# Patient Record
Sex: Male | Born: 1990 | Race: Black or African American | Hispanic: No | Marital: Single | Smoking: Never smoker
Health system: Southern US, Community
[De-identification: ages and names within clinical notes are randomized; demographics above are authoritative.]

---
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
## 2011-03-10 ENCOUNTER — Emergency Department (HOSPITAL_COMMUNITY)
Admission: EM | Admit: 2011-03-10 | Discharge: 2011-03-10 | Disposition: A | Discharge status: Home / Self Care | Payer: Medicaid Other | Attending: Emergency Medicine | Admitting: Emergency Medicine

## 2011-03-10 ENCOUNTER — Emergency Department (HOSPITAL_COMMUNITY): Payer: Medicaid Other

## 2011-03-10 DIAGNOSIS — M25539 Pain in unspecified wrist: Secondary | ICD-10-CM | POA: Insufficient documentation

## 2011-03-10 DIAGNOSIS — Y9355 Activity, bike riding: Secondary | ICD-10-CM | POA: Insufficient documentation

## 2012-01-03 ENCOUNTER — Emergency Department (HOSPITAL_COMMUNITY): Payer: Medicaid Other

## 2012-01-03 ENCOUNTER — Emergency Department (HOSPITAL_COMMUNITY)
Admission: EM | Admit: 2012-01-03 | Discharge: 2012-01-03 | Disposition: A | Discharge status: Home / Self Care | Payer: Medicaid Other | Attending: Emergency Medicine | Admitting: Emergency Medicine

## 2012-01-03 ENCOUNTER — Encounter (HOSPITAL_COMMUNITY): Payer: Self-pay | Admitting: Emergency Medicine

## 2012-01-03 DIAGNOSIS — R55 Syncope and collapse: Secondary | ICD-10-CM | POA: Insufficient documentation

## 2012-01-03 DIAGNOSIS — R002 Palpitations: Secondary | ICD-10-CM | POA: Insufficient documentation

## 2012-01-03 DIAGNOSIS — R079 Chest pain, unspecified: Secondary | ICD-10-CM | POA: Insufficient documentation

## 2012-01-03 LAB — DIFFERENTIAL
Basophils Absolute: 0 10*3/uL (ref 0.0–0.1)
Eosinophils Absolute: 0.4 10*3/uL (ref 0.0–0.7)
Eosinophils Relative: 4 % (ref 0–5)
Neutrophils Relative %: 64 % (ref 43–77)

## 2012-01-03 LAB — BASIC METABOLIC PANEL
Calcium: 9.6 mg/dL (ref 8.4–10.5)
GFR calc Af Amer: >90 mL/min (ref >90)
GFR calc non Af Amer: >90 mL/min (ref >90)
Potassium: 3.8 mEq/L (ref 3.5–5.1)
Sodium: 139 mEq/L (ref 135–145)

## 2012-01-03 LAB — CBC
MCH: 26.9 pg (ref 26.0–34.0)
MCV: 82.5 fL (ref 78.0–100.0)
Platelets: 177 10*3/uL (ref 150–400)
RBC: 5.09 MIL/uL (ref 4.22–5.81)
RDW: 14.5 % (ref 11.5–15.5)
WBC: 10.5 10*3/uL (ref 4.0–10.5)

## 2012-01-03 MED ORDER — IBUPROFEN 800 MG PO TABS: 800.0000 mg | ORAL_TABLET | Freq: Three times a day (TID) | ORAL | Status: AC | PRN

## 2012-01-03 NOTE — ED Notes (Signed)
Pt taken to xray 

## 2012-01-03 NOTE — ED Notes (Signed)
Dr Fredderick Phenix at bedside, and pt given crackers and coke.

## 2012-01-03 NOTE — Discharge Instructions (Signed)
Chest Pain (Nonspecific) Chest pain has many causes. Your pain could be caused by something serious, such as a heart attack or a blood clot in the lungs. It could also be caused by something less serious, such as a chest bruise or a virus. Follow up with your doctor. More lab tests or other studies may be needed to find the cause of your pain. Most of the time, nonspecific chest pain will improve within 2 to 3 days of rest and mild pain medicine. HOME CARE  For chest bruises, you may put ice on the sore area for 15 to 20 minutes, 3 to 4 times a day. Do this only if it makes you or your child feel better.   Put ice in a plastic bag.   Place a towel between the skin and the bag.   Rest for the next 2 to 3 days.   Go back to work if the pain improves.   See your doctor if the pain lasts longer than 1 to 2 weeks.   Only take medicine as told by your doctor.   Quit smoking if you smoke.  GET HELP RIGHT AWAY IF:   There is more pain or pain that spreads to the arm, neck, jaw, back, or belly (abdomen).   You or your child has shortness of breath.   You or your child coughs more than usual or coughs up blood.   You or your child has very bad back or belly pain, feels sick to his or her stomach (nauseous), or throws up (vomits).   You or your child has very bad weakness.   You or your child passes out (faints).   You or your child has a temperature by mouth above 102 F (38.9 C), not controlled by medicine.  Any of these problems may be serious and may be an emergency. Do not wait to see if the problems will go away. Get medical help right away. Call your local emergency services 911 in [place] Do not drive yourself to the hospital. MAKE SURE YOU:   Understand these instructions.   Will watch this condition.   Will get help right away if you or your child is not doing well or gets worse.  Document Released: 02/17/2008 Document Revised: 08/20/2011 Document Reviewed:  02/17/2008 [facility] Patient Information 2012 [place], [place].Syncope You have had a fainting (syncopal) spell. A fainting episode is a sudden, short-lived loss of consciousness. It results in complete recovery. It occurs because there has been a temporary shortage of oxygen and/or sugar (glucose) to the brain. CAUSES   Blood pressure pills and other medications that may lower blood pressure below normal. Sudden changes in posture (sudden standing).   Over-medication. Take your medications as directed.   Standing too long. This can cause blood to pool in the legs.   Seizure disorders.   Low blood sugar (hypoglycemia) of diabetes. This more commonly causes coma.   Bearing down to go to the bathroom. This can cause your blood pressure to rise suddenly. Your body compensates by making the blood pressure too low when you stop bearing down.   Hardening of the arteries where the brain temporarily does not receive enough blood.   Irregular heart beat and circulatory problems.   Fear, emotional distress, injury, sight of blood, or illness.  Your caregiver will send you home if the syncope was from non-worrisome causes (benign). Depending on your age and health, you may stay to be monitored and observed. If you return  home, have someone stay with you if your caregiver feels that is desirable. It is very important to keep all follow-up referrals and appointments in order to properly manage this condition. This is a serious problem which can lead to serious illness and death if not carefully managed.  WARNING: Do not drive or operate machinery until your caregiver feels that it is safe for you to do so. SEEK IMMEDIATE MEDICAL CARE IF:   You have another fainting episode or faint while lying or sitting down. DO NOT DRIVE YOURSELF. Call 911 if no other help is available.   You have chest pain, are feeling sick to your stomach (nausea), vomiting or abdominal pain.   You have an irregular heartbeat or  one that is very fast (pulse over 120 beats per minute).   You have a loss of feeling in some part of your body or lose movement in your arms or legs.   You have difficulty with speech, confusion, severe weakness, or visual problems.   You become sweaty and/or feel light headed.  Make sure you are rechecked as instructed. Document Released: 08/31/2005 Document Revised: 08/20/2011 Document Reviewed: 04/21/2007 [facility] Patient Information 2012 [place], [place].

## 2012-01-03 NOTE — ED Notes (Signed)
Pt here for near syncopal episode after eating today, sts before episode felt a fluttering in chest, nad noted, abc intact, alert and oriented.

## 2012-01-03 NOTE — ED Notes (Addendum)
Pt reports decreased appetite today with headache and abd pain.  States he ate steak sandwich at friend's house approx 1 hour ago and then passed out for 30-45 minutes.  Denies falling and hitting head.  States EMS came but he did not get transported.

## 2012-01-04 NOTE — ED Provider Notes (Signed)
History     CSN: [id number]  Arrival date & time 01/03/12  1928   First MD Initiated Contact with Patient 01/03/12 1949      Chief Complaint  Patient presents with  . Near Syncope    syncopal episode    Location-substernal/No radiation/quality-dull/duration-~1 hours/timing-intermittent/severity-moderate/associated sxs-syncope/No prior treatment) Patient is a 21 y.o. male presenting with syncope. The history is provided by the patient. No language interpreter was used.  Loss of Consciousness This is a new problem. The current episode started today. The problem occurs rarely. The problem has been resolved. Associated symptoms include chest pain. Pertinent negatives include no abdominal pain, change in bowel habit, chills, congestion, coughing, diaphoresis, fatigue, fever, headaches, nausea, neck pain, numbness, rash, urinary symptoms, vertigo, visual change, vomiting or weakness. The symptoms are aggravated by nothing. He has tried nothing for the symptoms.    History reviewed. No pertinent past medical history.  History reviewed. No pertinent past surgical history.  No family history on file.  History  Substance Use Topics  . Smoking status: Current Everyday Smoker  . Smokeless tobacco: Not on file  . Alcohol Use: Yes      Review of Systems  Constitutional: Negative for fever, chills, diaphoresis and fatigue.  HENT: Negative for congestion, neck pain and neck stiffness.   Eyes: Negative for visual disturbance.  Respiratory: Negative for cough, chest tightness and shortness of breath.   Cardiovascular: Positive for chest pain and syncope. Negative for palpitations and leg swelling.  Gastrointestinal: Negative for nausea, vomiting, abdominal pain, diarrhea, constipation, blood in stool, abdominal distention and change in bowel habit.  Genitourinary: Negative for dysuria, urgency, hematuria and difficulty urinating.  Musculoskeletal: Negative for back pain and gait problem.   Skin: Negative for rash.  Neurological: Positive for syncope. Negative for dizziness, vertigo, tremors, seizures, facial asymmetry, speech difficulty, weakness, light-headedness, numbness and headaches.  Hematological: Negative for adenopathy. Does not bruise/bleed easily.  Psychiatric/Behavioral: Negative for confusion.    Allergies  Review of patient's allergies indicates no known allergies.  Home Medications   Current Outpatient Rx  Name Route Sig Dispense Refill  . IBUPROFEN 800 MG PO TABS Oral Take 1 tablet (800 mg total) by mouth every 8 (eight) hours as needed for pain. 30 tablet 0    BP 95/42  Pulse 76  Temp(Src) 97.8 F (36.6 C) (Oral)  Resp 23  SpO2 96%  Physical Exam  Constitutional: He is oriented to person, place, and time. He appears well-developed and well-nourished. No distress.  HENT:  Head: Normocephalic.  Eyes: Conjunctivae and EOM are normal. Pupils are equal, round, and reactive to light.  Neck: Normal range of motion. Neck supple. No JVD present.  Cardiovascular: Normal rate, regular rhythm, normal heart sounds and intact distal pulses.   No murmur heard. Pulmonary/Chest: Effort normal and breath sounds normal. No stridor. No respiratory distress. He has no wheezes. He has no rales. He exhibits no tenderness.  Abdominal: Soft. Bowel sounds are normal. He exhibits no distension and no mass. There is no tenderness. There is no rebound and no guarding.  Musculoskeletal: Normal range of motion. He exhibits no edema and no tenderness.  Neurological: He is alert and oriented to person, place, and time. He has normal strength. No cranial nerve deficit or sensory deficit. Coordination normal. GCS eye subscore is 4. GCS verbal subscore is 5. GCS motor subscore is 6.  Skin: Skin is warm and dry. He is not diaphoretic.  Psychiatric: He has a normal mood and affect.  ED Course  Procedures (including critical care time)  Labs Reviewed  BASIC METABOLIC PANEL -  Abnormal; Notable for the following:    Glucose, Bld 117 (*)    All other components within normal limits  CBC  DIFFERENTIAL   Dg Chest 2 View  01/03/2012  *RADIOLOGY REPORT*  Clinical Data: Chest pain and syncope.  Smoker.  CHEST - 2 VIEW  Comparison: None.  Findings: Normal heart size and pulmonary vascularity.  No focal airspace consolidation in the lungs.  No blunting of costophrenic angles.  No pneumothorax.  IMPRESSION: No evidence of active pulmonary disease.  Original Report Authenticated By: Marlon Pel, M.D.     1. Chest pain   2. Syncope       Date: 01/04/2012  Rate: 78  Rhythm: normal sinus rhythm  QRS Axis: indeterminate  Intervals: normal  ST/T Wave abnormalities: nonspecific ST changes  Conduction Disutrbances:none  Narrative Interpretation: Slight ST elevation <1 box in V1 and V2.  Old EKG Reviewed: none available   MDM  Pt is a well appearing 20yo AAM who reports hx of murmur per prison physician and chest pain. Pt had an episode of CP, palpitations and syncope today. Witnessed, no seizure like activity. Sx resolved presently. VSS. AF. NAD. EKG as above along with one isolated beat in V2 with type 1 brugada quality. Cardiology consulted who recommends overnight observation but pt states "he has things to do and can't stay". Cardiology stated they would set up f/u for pt. No focal neuro deficits. Syncope possibly vasovagal vs arrhythmia. D/C home in stable and asymptomatic condition.         Consuello Masse, MD 01/04/12 0100

## 2012-01-04 NOTE — Consult Note (Signed)
Tyler Mccarthy, AGENA NO.:  [id number]  MEDICAL RECORD NO.:  [id number]  LOCATION:  [facility]                        FACILITY:  [facility]  PHYSICIAN:  Natasha Bence, MD       DATE OF BIRTH:  [birth year]  DATE OF CONSULTATION:  01/03/2012 DATE OF DISCHARGE:                                CONSULTATION   CONSULTING PHYSICIAN:  Dr. March Rummage in the ER.  REASON FOR CONSULTATION:  The patient with syncope.  HISTORY OF PRESENT ILLNESS:  Tyler Mccarthy is a 21 year old white male with no past medical history, who presented after an episode of syncope earlier this evening.  He reports he smoked marijuana at approximately 7 o'clock.  Approximately1/2 hour after that he went to get something to eat.  While he was eating, he felt relatively fine and then became somewhat diaphoretic.  Upon rising, he became lightheaded with heart racing and fell over.  He reports that he thinks he lost complete consciousness.  His mom and a sister say he was out for approximately 5 minutes.  Witnesses say that he had some convulsing type movements and his eyes rolled back.  He does remember few minutes later hearing voices.  He did not have any postictal state.  He just remembers very short duration palpitations and chest tightness that was substernal and did not radiate.  He has not had any shortness of breath.  He has never had any previous lightheadedness or syncope that he can recall.  He is sure that there was nothing else in the marijuana.  He denies any other stimulant medications, over-the-counter, or illicit drugs.  He has not had any recent fevers, chills, or sweats.  No recent viral illnesses. Otherwise complete, review of systems was performed and was negative.  PAST MEDICAL HISTORY:  None.  PAST SURGICAL HISTORY:  None.  FAMILY HISTORY:  No history of sudden cardiac death or early death.  SOCIAL HISTORY:  Does use marijuana.  No other illicit drug use.  Uses occasional alcohol.  HOME  MEDICATIONS:  None.  ALLERGIES:  No known drug allergies.  PHYSICAL EXAMINATION:  VITAL SIGNS:  Temperature is 97.8, pulse 76 and regular, respiratory rate 23, blood pressure 95/42, O2 sats 96% on room air. GENERAL:  He is a well-developed, well-nourished white male; in no apparent distress. EYES:  He has anicteric sclerae.  Pupils equal, round, and reactive. NECK:  Normal jugular venous pressure.  No carotid bruits. LUNGS:  Clear to auscultation bilaterally. CARDIOVASCULAR:  Regular rate and rhythm.  No murmurs, rubs, or gallops. Nondisplaced PMI. ABDOMEN:  Soft, nontender, and nondistended.  No masses. EXTREMITIES:  Warm with no edema.  He has symmetrical pulses throughout. NEUROLOGIC:  Grossly afocal. SKIN:  No rashes or ulcers.  LABORATORY DATA:  Sodium 139, potassium 3.8, chloride 103, bicarb 25, BUN 13, creatinine 1.07, calcium 9.6, glucose 117.  White count 10.5 with normal differential, hemoglobin 13.7, and platelet count 177. Chest x-ray shows no acute infiltrate or effusion.  EKG shows sinus mechanism, rate of 78 beats per minute.  No acute ischemic changes.  He does have one isolated QRS complex, which is a sinus beat in leads V1 through V3, which does  have unusual sloping of his ST segments and a right bundle-branch pattern.  I performed a very limited bedside echo, as the ER physician thought he had a thick septum on their examination. His LV function is normal.  He has no obvious segmental wall motion abnormalities.  He does not have any obvious hypertrophy.  No pericardial effusion.  IMPRESSION AND PLAN:  This is a 21 year old white male with known past medical history, who presents after an episode of syncope now.  It does sound like he did lose consciousness briefly concerning for vasovagal versus arrhythmic syncope given his history.  This was in the setting of intoxication with marijuana.  No other obvious illicit drug use. Recommended checking a urine drug  screen and admission for further evaluation with formal echocardiogram and repeat electrocardiogram.  The patient is adamant.  He does not want to be admitted tonight because he has something to do.  Discussed the risks of potentially fatal arrhythmic event, and he and his sister understand the repercussions. He does agree to follow up in the clinic.  His electrocardiogram is basically normal; however, interestingly he does have one isolated beat in leads V1, V2, and V3, which have sort of a Brugada-like pattern. However, this is just a one isolated beat and may be artifactual.  He has no obvious sympathomimetic triggers, although his marijuana could have ultimately been laced with some additional agents.  The patient is leaving the hospital against my medical advice and will be followed up in the clinic.  I discussed this plan with the ED.          ______________________________ Natasha Bence, MD     MH/MEDQ  D:  01/03/2012  T:  01/04/2012  Job:  [id number]

## 2012-01-06 NOTE — ED Provider Notes (Signed)
I saw and evaluated the patient, reviewed the resident's note and I agree with the findings and plan. Pt with palpitations, syncope, ?evidence of Brugada on EKG.  Pt refusing to be admitted for obs.  Cards to follow  Rolan Bucco, MD 01/06/12 1456

## 2012-01-11 IMAGING — CR DG WRIST COMPLETE 3+V*R*
4 series · 4 of 4 positions shown · non-contrast
Comparison: None

CLINICAL DATA: Fall, wrist pain.

RIGHT WRIST - COMPLETE 3+ VIEW

[x wrist pa right *]
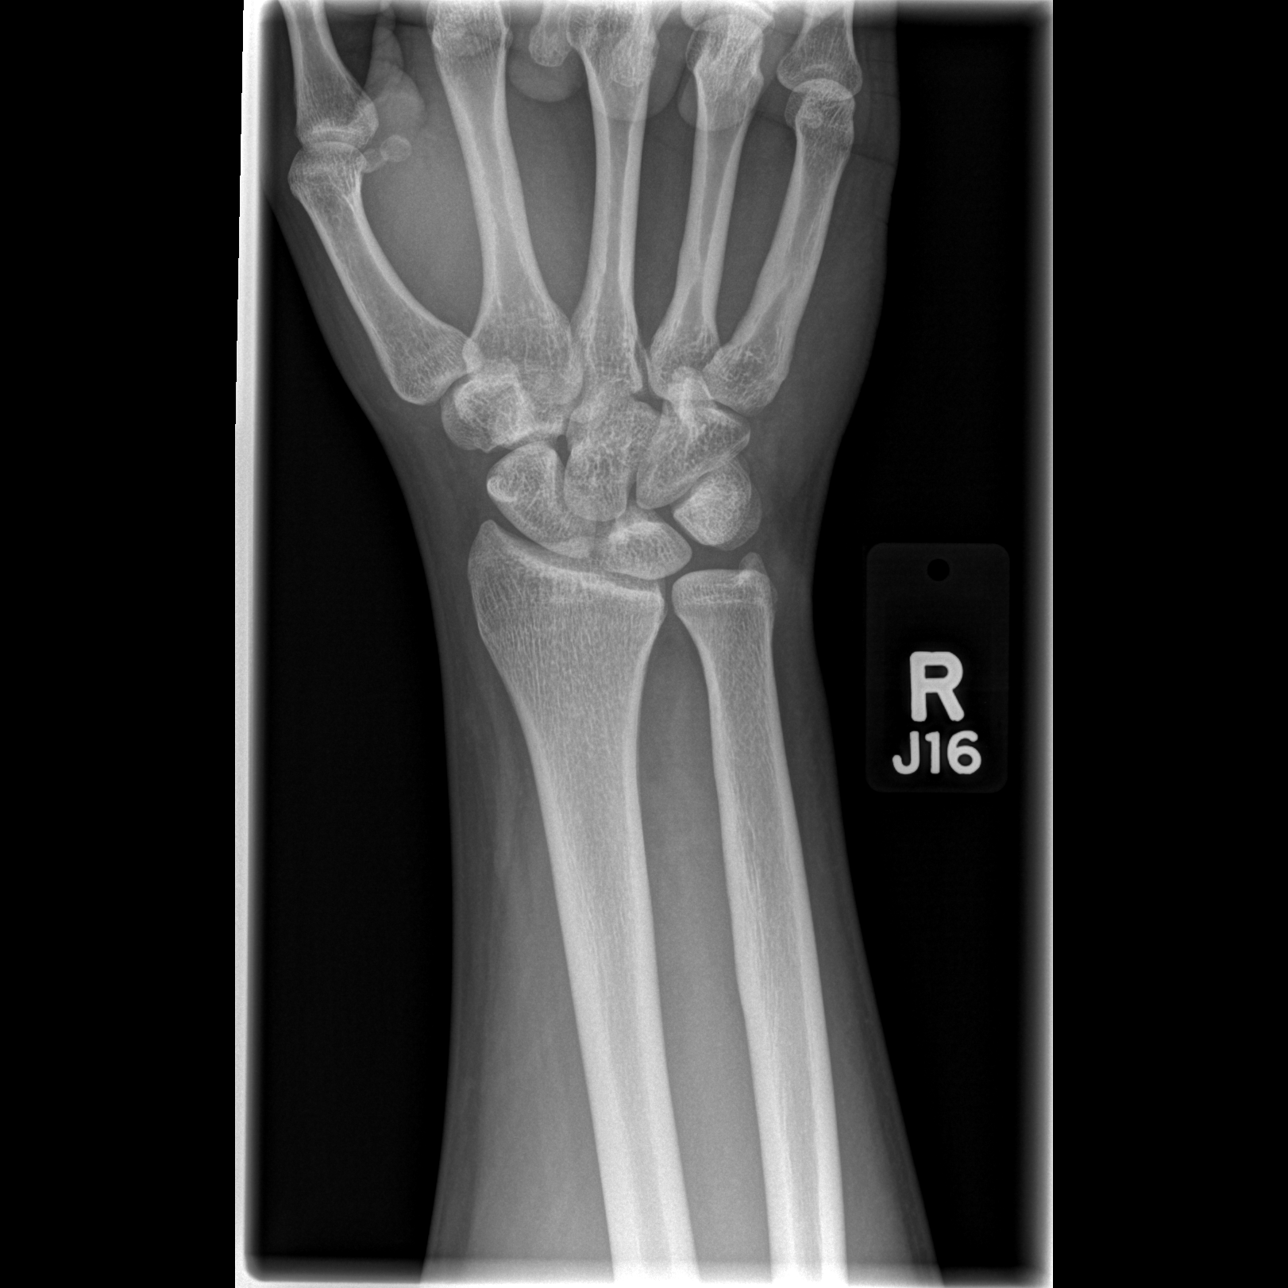

[x wrist obl right]
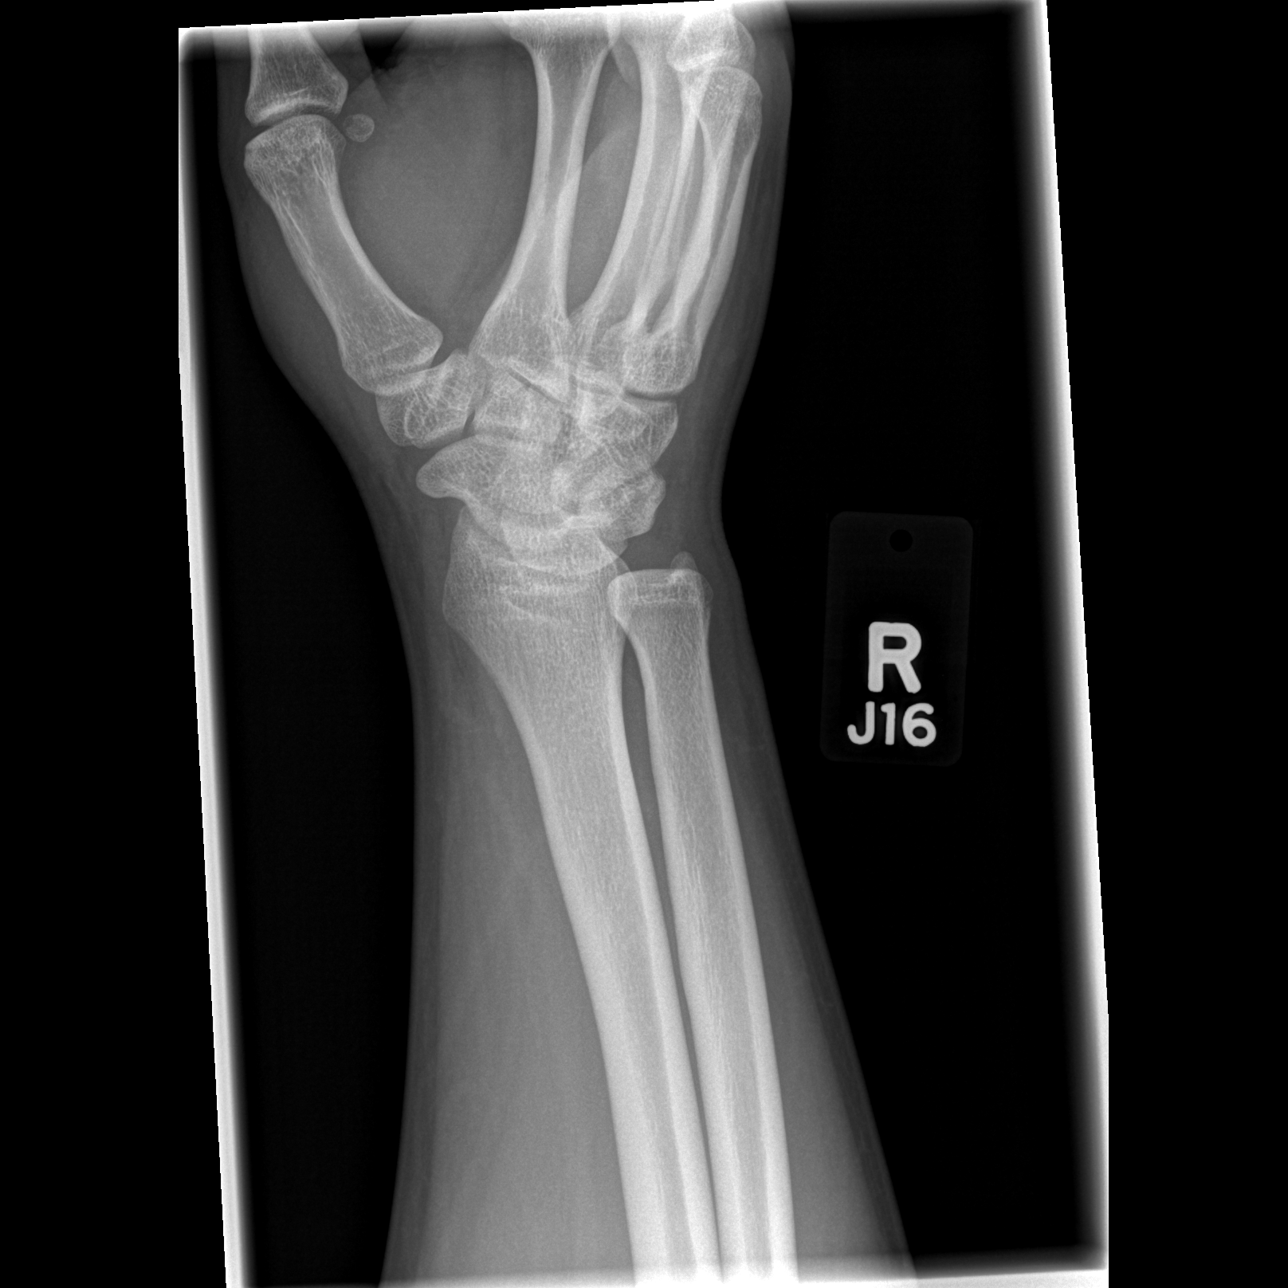

[x wrist lat right]
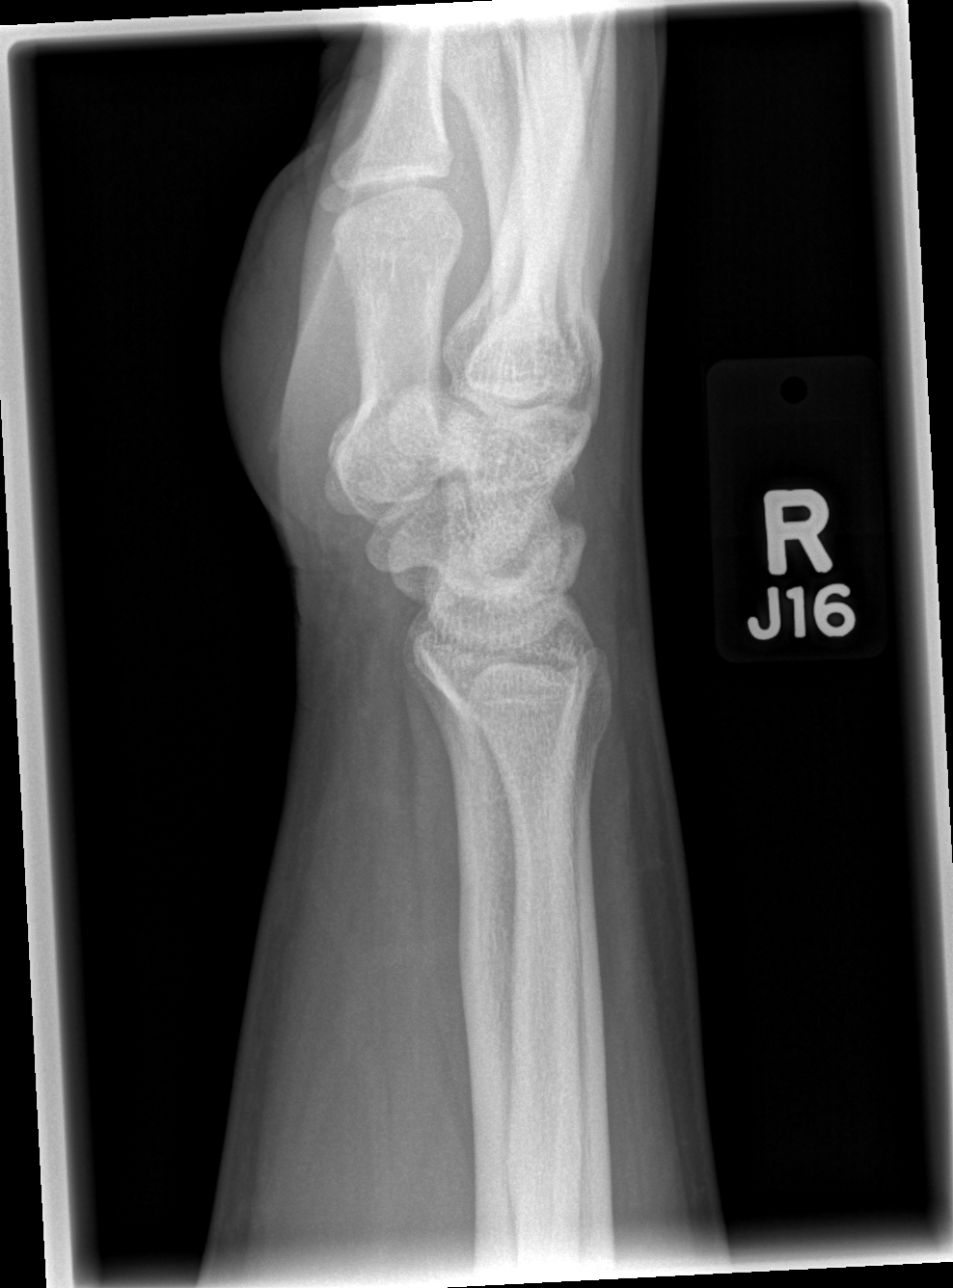

[x navicular *]
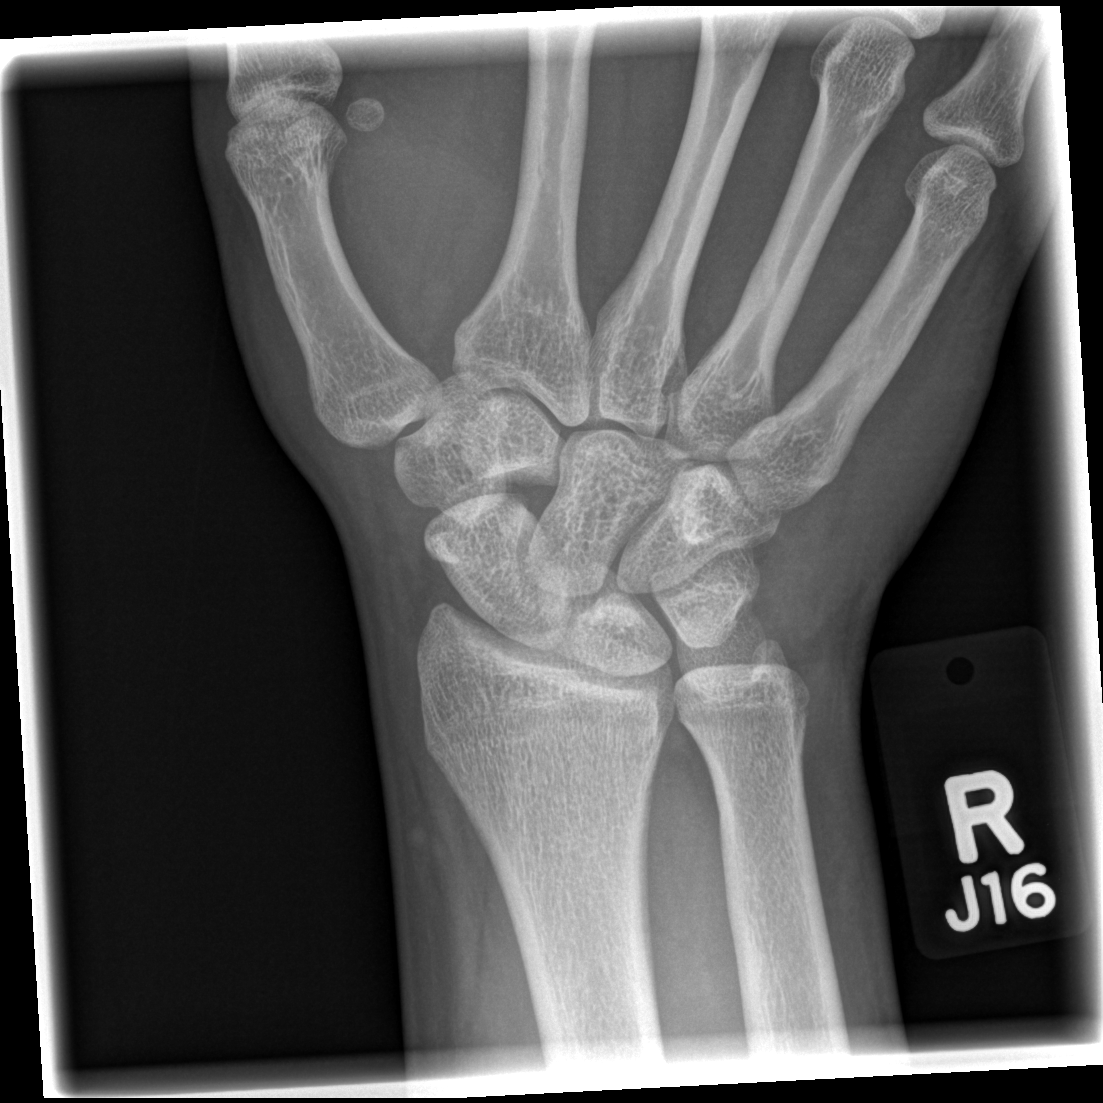

[4 of 4 positions shown; findings below may reference images not displayed]

FINDINGS: No acute bony abnormality.  Specifically, no fracture,
subluxation, or dislocation.  Soft tissues are intact.
IMPRESSION: No acute bony abnormality.

## 2012-11-05 IMAGING — CR DG CHEST 2V
2 series · 2 of 2 positions shown · non-contrast
Comparison: None.

CLINICAL DATA: Chest pain and syncope.  Smoker.

CHEST - 2 VIEW

[w chest pa]
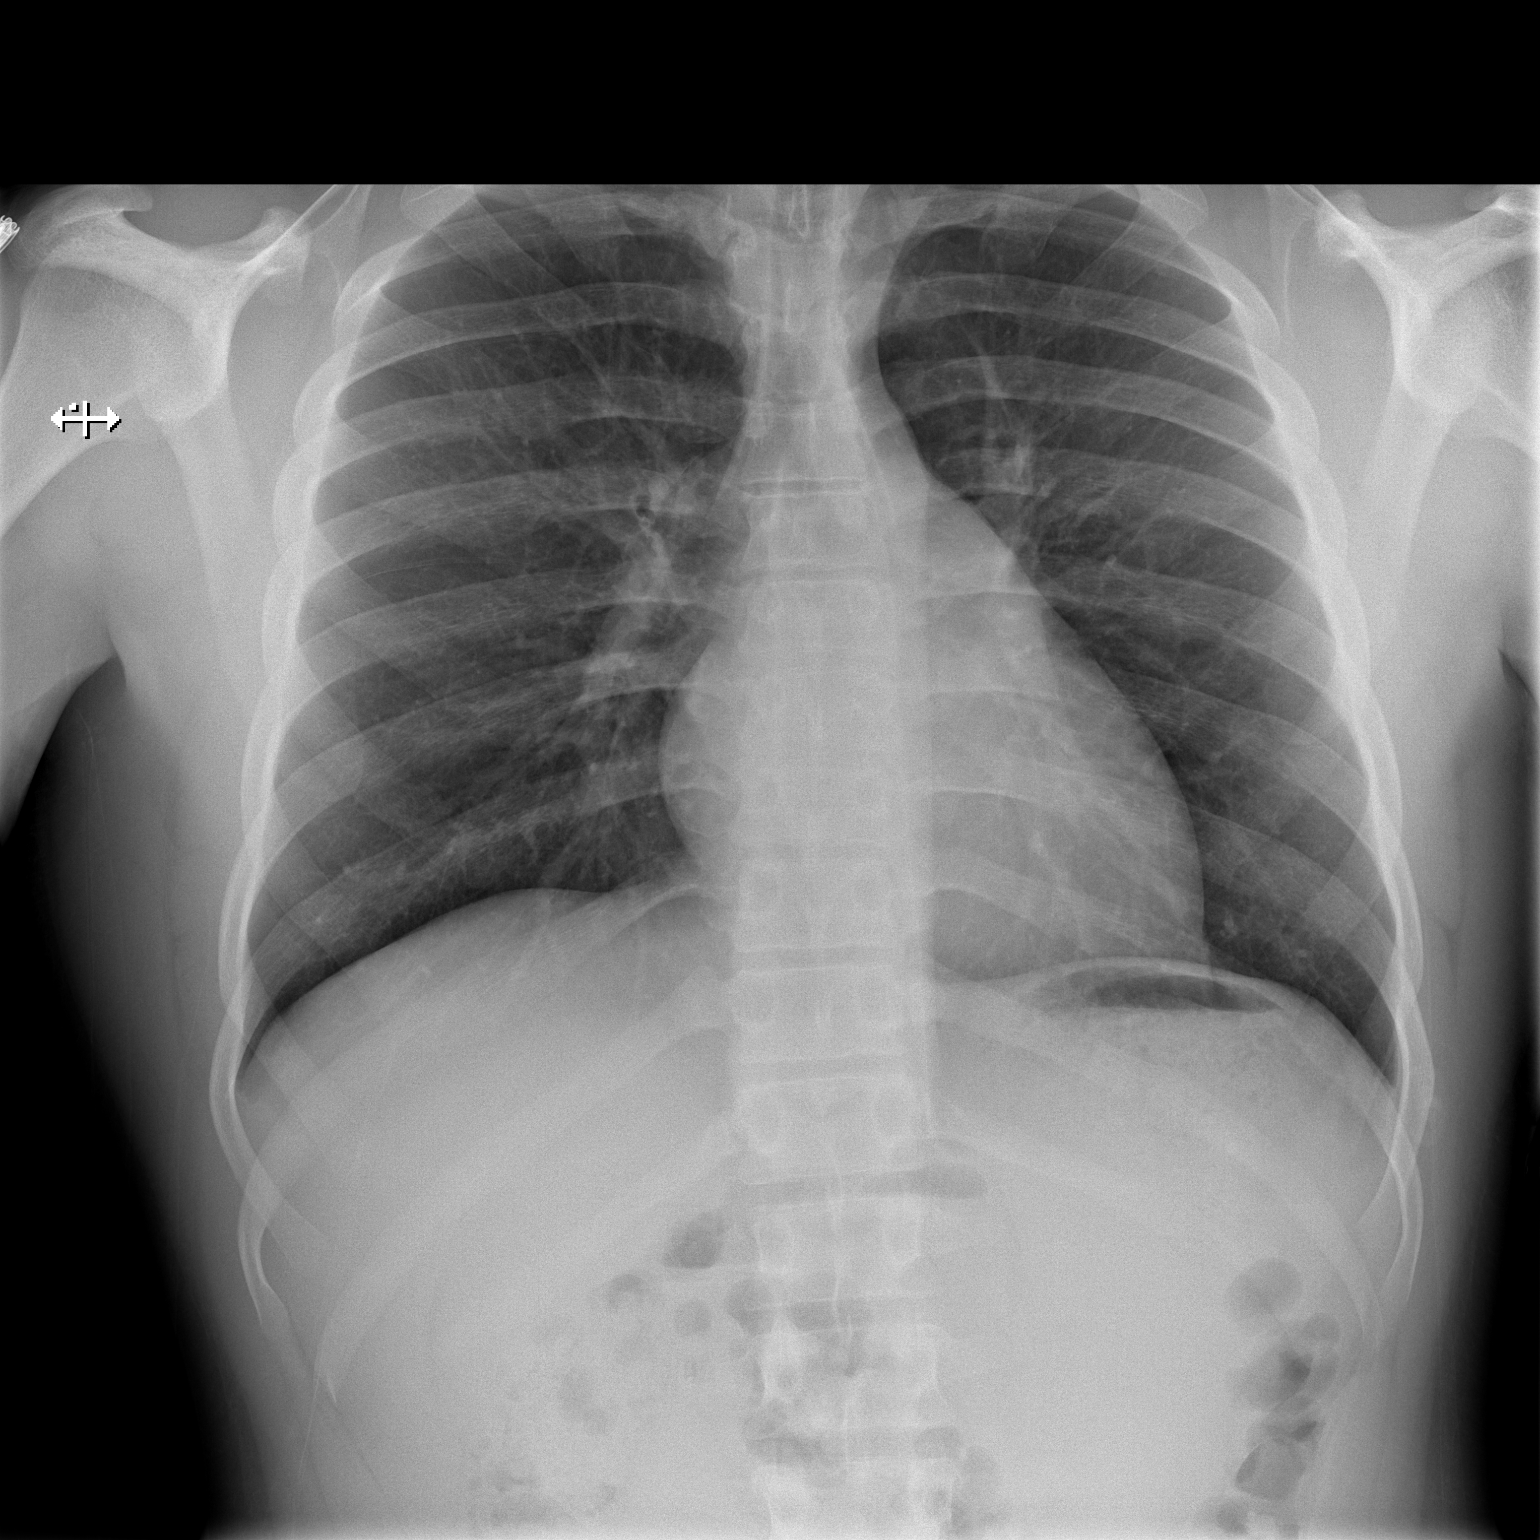

[w chest lat]
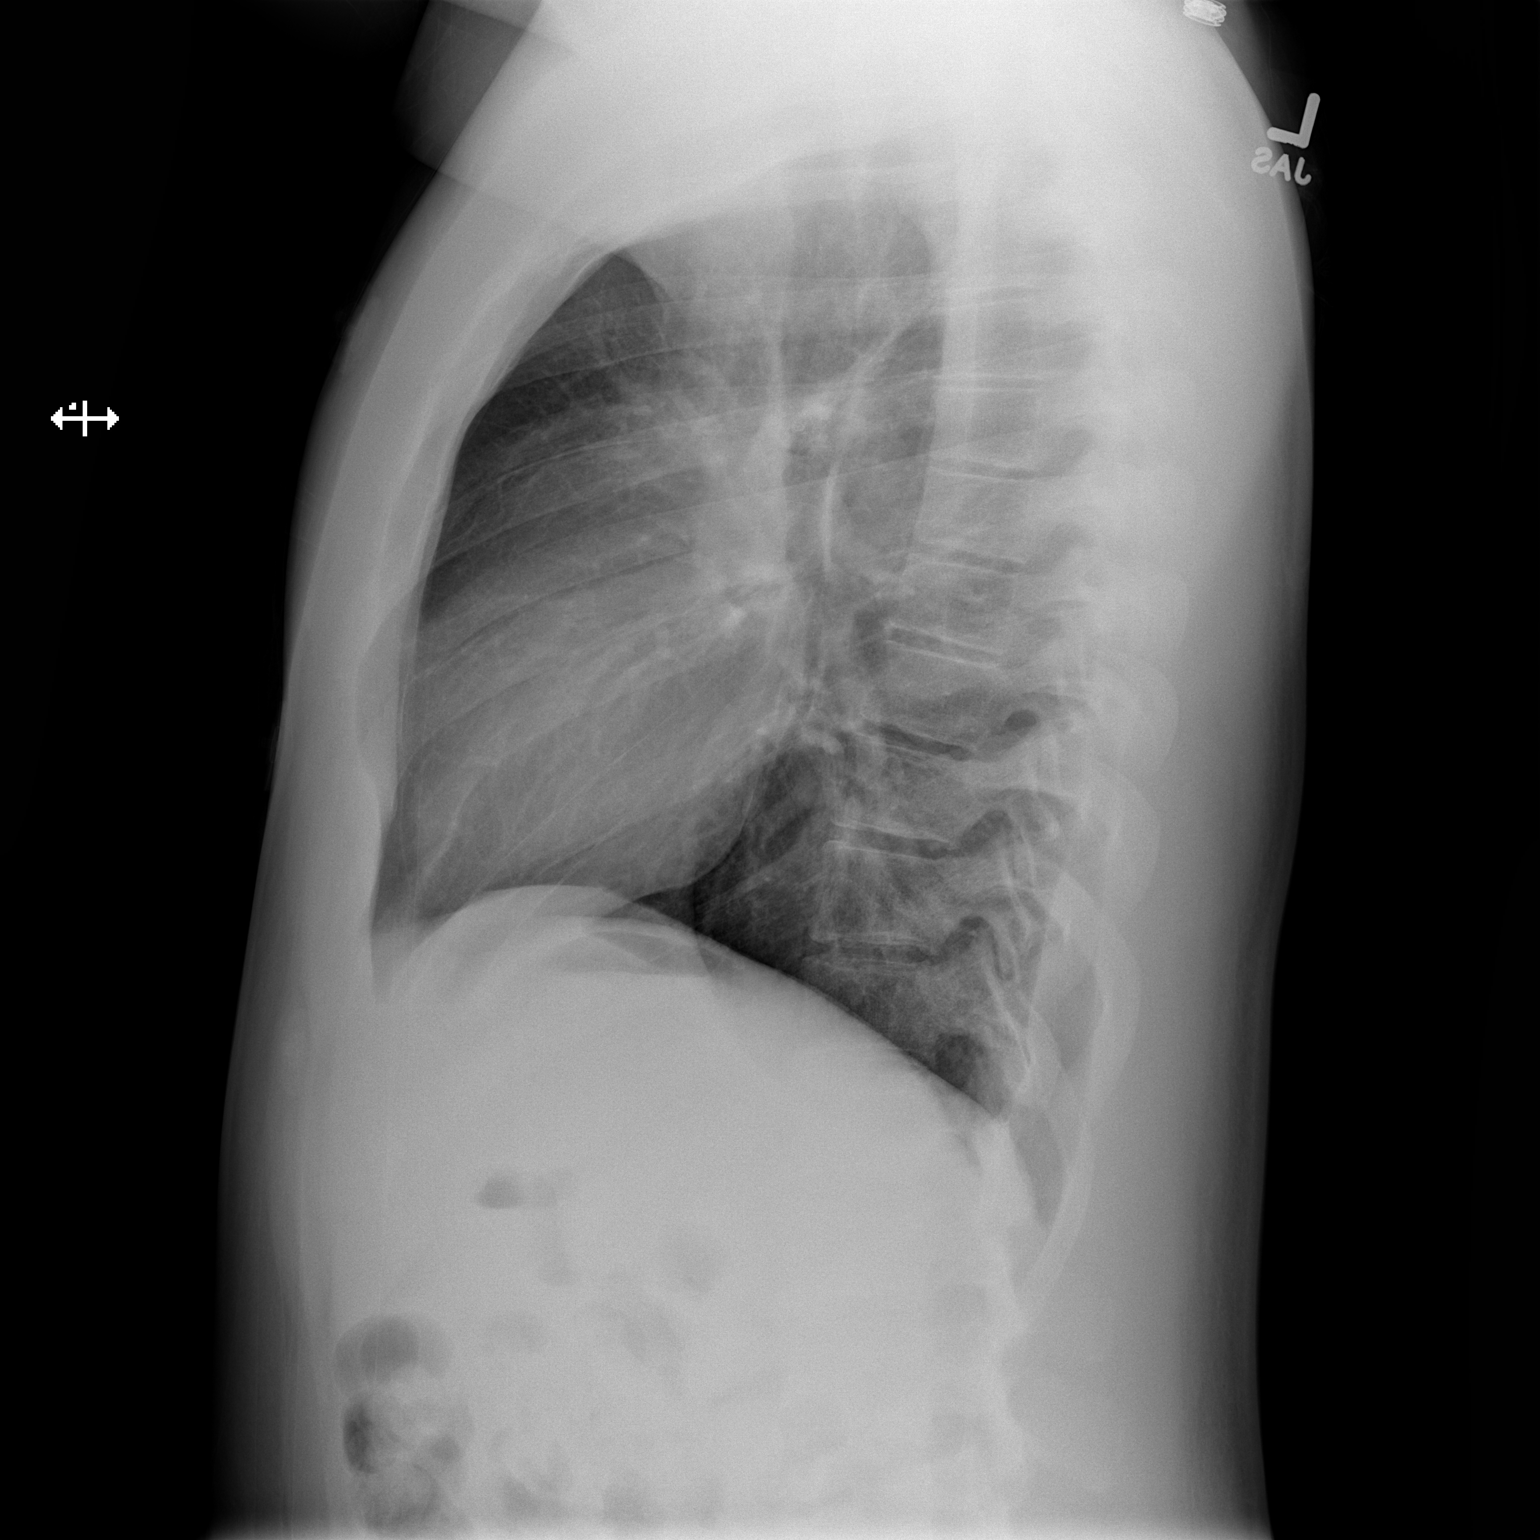

[2 of 2 positions shown; findings below may reference images not displayed]

FINDINGS: Normal heart size and pulmonary vascularity.  No focal
airspace consolidation in the lungs.  No blunting of costophrenic
angles.  No pneumothorax.
IMPRESSION: No evidence of active pulmonary disease.

## 2020-03-07 ENCOUNTER — Emergency Department
Admission: EM | Admit: 2020-03-07 | Discharge: 2020-03-08 | Disposition: A | Discharge status: Home / Self Care | Payer: Self-pay | Attending: Emergency Medicine | Admitting: Emergency Medicine

## 2020-03-07 ENCOUNTER — Encounter: Payer: Self-pay | Admitting: Emergency Medicine

## 2020-03-07 ENCOUNTER — Other Ambulatory Visit: Payer: Self-pay

## 2020-03-07 DIAGNOSIS — Z5321 Procedure and treatment not carried out due to patient leaving prior to being seen by health care provider: Secondary | ICD-10-CM | POA: Insufficient documentation

## 2020-03-07 DIAGNOSIS — Z0289 Encounter for other administrative examinations: Secondary | ICD-10-CM | POA: Insufficient documentation

## 2020-03-07 NOTE — ED Notes (Signed)
No protocols at this time per Dr. Goodman. 

## 2020-03-07 NOTE — ED Notes (Signed)
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

## 2020-03-07 NOTE — ED Triage Notes (Signed)
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  Denies symptoms right now but states when "it kicks in" he will feel hot, sweating, trouble sleeping, denies other drugs or alcohol.  Denies SI/HI, pt calm and cooperative in triage.

## 2020-03-07 NOTE — ED Notes (Signed)
Urine with save label sent to lab if needed.

## 2020-05-27 ENCOUNTER — Emergency Department: Admission: EM | Admit: 2020-05-27 | Discharge: 2020-05-27 | Discharge status: Against Medical Advice | Payer: Self-pay

## 2020-05-27 ENCOUNTER — Other Ambulatory Visit: Payer: Self-pay

## 2020-05-27 NOTE — ED Triage Notes (Deleted)
Pt states he took 14g of molly and meth last night, pt is unable to sit still, keeps trying to take his clothes off,. Pt c/o LLQ and RUQ pain.  °

## 2020-05-27 NOTE — ED Triage Notes (Signed)
Pt called for triage, no response. Not able to locate pt in the [facility]

## 2020-05-27 NOTE — ED Triage Notes (Signed)
Pt in via [facility] from [employer] with withdrawals of K2. Pt has not taken it in 1 day.

## 2020-06-24 ENCOUNTER — Emergency Department
Admission: EM | Admit: 2020-06-24 | Discharge: 2020-06-24 | Disposition: A | Discharge status: Home / Self Care | Payer: Self-pay | Attending: Emergency Medicine | Admitting: Emergency Medicine

## 2020-06-24 ENCOUNTER — Encounter: Payer: Self-pay | Admitting: Emergency Medicine

## 2020-06-24 ENCOUNTER — Other Ambulatory Visit: Payer: Self-pay

## 2020-06-24 DIAGNOSIS — F17210 Nicotine dependence, cigarettes, uncomplicated: Secondary | ICD-10-CM | POA: Insufficient documentation

## 2020-06-24 DIAGNOSIS — K029 Dental caries, unspecified: Secondary | ICD-10-CM | POA: Insufficient documentation

## 2020-06-24 MED ORDER — AMOXICILLIN 875 MG PO TABS: 875.0000 mg | ORAL_TABLET | Freq: Two times a day (BID) | ORAL | 0 refills | Status: DC

## 2020-06-24 MED ORDER — IBUPROFEN 600 MG PO TABS: 600.0000 mg | ORAL_TABLET | Freq: Three times a day (TID) | ORAL | 0 refills | Status: AC | PRN

## 2020-06-24 NOTE — ED Triage Notes (Signed)
Says left dental pain for a month.

## 2020-06-24 NOTE — Discharge Instructions (Signed)
Begin taking antibiotics as directed until completely finished.  Ibuprofen 600 mg with food as needed for dental pain.  Also a list of dental clinics is listed on your discharge papers.  These are less expensive than a regular dental office.  Both [place] and [place] have walk-in hours where you do not have to have an appointment.  OPTIONS FOR DENTAL FOLLOW UP CARE  [place] [facility] - Local Safety Net Dental Clinics TripDoors.com.htm   [facility] [phone])  [place] [phone])  [place] [phone])  [facility] [phone])  [facility] [phone]) This clinic caters to the indigent population and is on a lottery system. Location: [employer] [facility], [employer], [address], [place] Clinic Hours: Wednesdays from 6pm - 9pm, patients seen by a lottery system. For dates, call or go to [email or web address] Services: Cleanings, fillings and simple extractions. Payment Options: DENTAL WORK IS FREE OF CHARGE. Bring proof of income or support. Best way to get seen: Arrive at 5:15 pm - this is a lottery, NOT first come/first serve, so arriving earlier will not increase your chances of being seen.     [facility] [phone] Select option 1 for emergencies   Location: [facility], [place], [address] [place] Clinic Hours: No walk-ins accepted - call the day before to schedule an appointment. Check in times are 9:30 am and 1:30 pm. Services: Simple extractions, temporary fillings, pulpectomy/pulp debridement, uncomplicated abscess drainage. Payment Options: PAYMENT IS DUE AT THE TIME OF SERVICE.  Fee is usually $100-200, additional surgical procedures (e.g. abscess drainage) may be extra. Cash, checks, Visa/MasterCard  accepted.  Can file Medicaid if patient is covered for dental - patient should call case worker to check. No discount for [facility] patients. Best way to get seen: MUST call the day before and get onto the schedule. Can usually be seen the next 1-2 days. No walk-ins accepted.     [facility] [phone]   Location: [facility], [address], [place] Clinic Hours: M, W, Th, F 8am or 1:30pm, Tues 9a or 1:30 - first come/first served. Services: Simple extractions, temporary fillings, uncomplicated abscess drainage.  You do not need to be an [facility] resident. Payment Options: PAYMENT IS DUE AT THE TIME OF SERVICE. Dental insurance, otherwise sliding scale - bring proof of income or support. Depending on income and treatment needed, cost is usually $50-200. Best way to get seen: Arrive early as it is first come/first served.     [facility] [phone]   Location: [address] Clinic Hours: Mon-Thu 8a-5p Services: Most basic dental services including extractions and fillings. Payment Options: PAYMENT IS DUE AT THE TIME OF SERVICE. Sliding scale, up to 50% off - bring proof if income or support. Medicaid with dental option accepted. Best way to get seen: Call to schedule an appointment, can usually be seen within 2 weeks OR they will try to see walk-ins - show up at 8a or 2p (you may have to wait).     [facility] [phone] [place] RESIDENTS ONLY   Location: [facility], [address], [place], [place] [address] Clinic Hours: By appointment only. Monday - Thursday 8am-5pm, Friday 8am-12pm Services: Cleanings, fillings, extractions. Payment Options: PAYMENT IS DUE AT THE TIME OF SERVICE. Cash, Visa or MasterCard. Sliding scale - $  30 minimum per service. Best way to get seen: Come in to office, complete packet and make an  appointment - need proof of income or support monies for each household member and proof of [facility] residence. Usually takes about a month to get in.     [facility] [phone]   Location: [address] [place] Clinic Hours: Walk-in Urgent Care Dental Services are offered Monday-Friday mornings only. The numbers of emergencies accepted daily is limited to the number of providers available. Maximum 15 - Mondays, Wednesdays & Thursdays Maximum 10 - Tuesdays & Fridays Services: You do not need to be a [facility] resident to be seen for a dental emergency. Emergencies are defined as pain, swelling, abnormal bleeding, or dental trauma. Walkins will receive x-rays if needed. NOTE: Dental cleaning is not an emergency. Payment Options: PAYMENT IS DUE AT THE TIME OF SERVICE. Minimum co-pay is $40.00 for uninsured patients. Minimum co-pay is $3.00 for Medicaid with dental coverage. Dental Insurance is accepted and must be presented at time of visit. Medicare does not cover dental. Forms of payment: Cash, credit card, checks. Best way to get seen: If not previously registered with the clinic, walk-in dental registration begins at 7:15 am and is on a first come/first serve basis. If previously registered with the clinic, call to make an appointment.     [facility] [place] ONLY   Location: [address] [place], [place] Clinic Hours: Mon-Thu 10a-2p Services: Extractions only! Payment Options: FREE (donations accepted) - bring proof of income or support Best way to get seen: Call and schedule an appointment OR come at 8am on the 1st Monday of every month (except for holidays) when it is first come/first served.     [facility] [phone]   Location: [place], [place] Clinic Hours: Friday mornings Services, Payment Options, Best way to get seen: Call for info

## 2020-06-24 NOTE — ED Notes (Signed)
Signature pad not working, pt verbalizes understanding of d/c instructions, denies questions or concerns 

## 2020-06-24 NOTE — ED Notes (Signed)
See triage note- Pt here with left sided tooth pain. Pt holding jaw at this time due to pain, unable to sleep at night.

## 2020-06-24 NOTE — ED Provider Notes (Signed)
[facility] Emergency Department Provider Note  ____________________________________________   First MD Initiated Contact with Patient 06/24/20 1343     (approximate)  I have reviewed the triage vital signs and the nursing notes.   HISTORY  Chief Complaint Dental Pain   HPI Tyler Mccarthy. is a 29 y.o. male is here with complaint of dental pain.  Patient states that he has been hurting off and on for 1 month.  He denies any fever or chills.  There is been no swelling of his face.  Patient has continued to smoke cigarettes but decrease the amount.  Currently rates his pain as a 10/10.       History reviewed. No pertinent past medical history.  There are no problems to display for this patient.   History reviewed. No pertinent surgical history.  Prior to Admission medications   Medication Sig Start Date End Date Taking? Authorizing Provider  amoxicillin (AMOXIL) 875 MG tablet Take 1 tablet (875 mg total) by mouth 2 (two) times daily. 06/24/20   Tommi Rumps, PA-C  ibuprofen (ADVIL) 600 MG tablet Take 1 tablet (600 mg total) by mouth every 8 (eight) hours as needed. 06/24/20   Tommi Rumps, PA-C    Allergies Patient has no known allergies.  No family history on file.  Social History Social History   Tobacco Use  . Smoking status: Never Smoker  . Smokeless tobacco: Never Used  Substance Use Topics  . Alcohol use: Not Currently  . Drug use: Not on file    Review of Systems Constitutional: No fever/chills Eyes: No visual changes. ENT: No sore throat.  Positive dental pain. Cardiovascular: Denies chest pain. Respiratory: Denies shortness of breath. Gastrointestinal: No abdominal pain.  No nausea, no vomiting. Musculoskeletal: Negative for skeletal pain. Skin: Negative for rash. Neurological: Negative for headaches, focal weakness or numbness.  ____________________________________________   PHYSICAL EXAM:  VITAL  SIGNS: ED Triage Vitals  Enc Vitals Group     BP 06/24/20 1328 (!) 150/87     Pulse Rate 06/24/20 1328 (!) 59     Resp 06/24/20 1328 18     Temp 06/24/20 1328 98.1 F (36.7 C)     Temp Source 06/24/20 1328 Oral     SpO2 06/24/20 1328 100 %     Weight 06/24/20 1320 253 lb (114.8 kg)     Height 06/24/20 1320 5\' 8"  (1.727 m)     Head Circumference --      Peak Flow --      Pain Score 06/24/20 1319 10     Pain Loc --      Pain Edu? --      Excl. in GC? --     Constitutional: Alert and oriented. Well appearing and in no acute distress. Eyes: Conjunctivae are normal. PERRL. EOMI. Head: Atraumatic. Nose: No congestion/rhinnorhea. Mouth/Throat: Mucous membranes are moist.  Oropharynx non-erythematous.  Left lower posterior molar with large carry present.  Area is tender to touch with a tongue depressor.  No active drainage is noted. Neck: No stridor.   Cardiovascular: Normal rate, regular rhythm. Grossly normal heart sounds.  Good peripheral circulation. Respiratory: Normal respiratory effort.  No retractions. Lungs CTAB. Gastrointestinal: Soft and nontender. No distention.  Neurologic:  Normal speech and language. No gross focal neurologic deficits are appreciated. No gait instability. Skin:  Skin is warm, dry and intact. No rash noted. Psychiatric: Mood and affect are normal. Speech and behavior are normal.  ____________________________________________  LABS (all labs ordered are listed, but only abnormal results are displayed)  Labs Reviewed - No data to display  PROCEDURES  Procedure(s) performed (including Critical Care):  Procedures   ____________________________________________   INITIAL IMPRESSION / ASSESSMENT AND PLAN / ED COURSE  As part of my medical decision making, I reviewed the following data within the electronic MEDICAL RECORD NUMBER Notes from prior ED visits and [place] Controlled Substance Database  29 year old male presents to the ED with complaint of dental  pain.  Patient states that this been going on for 1 month.  A prescription for amoxicillin was sent to his pharmacy along with prescription for ibuprofen.  Patient was given a list of dental clinics in the area as he is not from here and is not sure where he should go. ____________________________________________   FINAL CLINICAL IMPRESSION(S) / ED DIAGNOSES  Final diagnoses:  Pain due to dental caries     ED Discharge Orders         Ordered    amoxicillin (AMOXIL) 875 MG tablet  2 times daily        06/24/20 1410    ibuprofen (ADVIL) 600 MG tablet  Every 8 hours PRN        06/24/20 1410          *Please note:  Tyler Mccarthy. was evaluated in Emergency Department on 06/24/2020 for the symptoms described in the history of present illness. He was evaluated in the context of the global COVID-19 pandemic, which necessitated consideration that the patient might be at risk for infection with the SARS-CoV-2 virus that causes COVID-19. Institutional protocols and algorithms that pertain to the evaluation of patients at risk for COVID-19 are in a state of rapid change based on information released by regulatory bodies including the CDC and federal and state organizations. These policies and algorithms were followed during the patient's care in the ED.  Some ED evaluations and interventions may be delayed as a result of limited staffing during and the pandemic.*   Note:  This document was prepared using Dragon voice recognition software and may include unintentional dictation errors.    Tommi Rumps, PA-C 06/24/20 1436    Gilles Chiquito, MD 06/24/20 1517

## 2020-06-27 ENCOUNTER — Emergency Department
Admission: EM | Admit: 2020-06-27 | Discharge: 2020-06-27 | Disposition: A | Discharge status: Home / Self Care | Payer: Medicaid Other | Attending: Emergency Medicine | Admitting: Emergency Medicine

## 2020-06-27 ENCOUNTER — Encounter: Payer: Self-pay | Admitting: Emergency Medicine

## 2020-06-27 ENCOUNTER — Other Ambulatory Visit: Payer: Self-pay

## 2020-06-27 DIAGNOSIS — K0889 Other specified disorders of teeth and supporting structures: Secondary | ICD-10-CM | POA: Insufficient documentation

## 2020-06-27 MED ORDER — LIDOCAINE VISCOUS HCL 2 % MT SOLN
15.0000 mL | Freq: Once | OROMUCOSAL | Status: AC
  Administered 2020-06-27: 15 mL via OROMUCOSAL
  Filled 2020-06-27: qty 15

## 2020-06-27 MED ORDER — TRAMADOL HCL 50 MG PO TABS: 50.0000 mg | ORAL_TABLET | Freq: Four times a day (QID) | ORAL | 0 refills | Status: AC | PRN

## 2020-06-27 MED ORDER — TRAMADOL HCL 50 MG PO TABS
50.0000 mg | ORAL_TABLET | Freq: Once | ORAL | Status: AC
  Administered 2020-06-27: 50 mg via ORAL
  Filled 2020-06-27: qty 1

## 2020-06-27 MED ORDER — LIDOCAINE VISCOUS HCL 2 % MT SOLN: 5.0000 mL | Freq: Four times a day (QID) | OROMUCOSAL | 0 refills | Status: DC | PRN

## 2020-06-27 MED ORDER — TRAMADOL HCL 50 MG PO TABS: 50.0000 mg | ORAL_TABLET | Freq: Four times a day (QID) | ORAL | 0 refills | Status: DC | PRN

## 2020-06-27 MED ORDER — IBUPROFEN 800 MG PO TABS
800.0000 mg | ORAL_TABLET | Freq: Once | ORAL | Status: AC
  Administered 2020-06-27: 800 mg via ORAL
  Filled 2020-06-27: qty 1

## 2020-06-27 NOTE — ED Notes (Signed)
Says pain no better.  Is making arrangements to go to [facility] health dental clinic, but needs to wait until he gets paid.

## 2020-06-27 NOTE — ED Provider Notes (Signed)
[facility] Emergency Department Provider Note   ____________________________________________   None    (approximate)  I have reviewed the triage vital signs and the nursing notes.   HISTORY  Chief Complaint Dental Pain    HPI Tyler Mccarthy. is a 29 y.o. male patient returns for continued dental pain.  Patient seen facility 3 days ago.  Patient state contacted dentist but cannot see him the next week.  Patient continues to take amoxicillin and ibuprofen.  Rates pain as 10/10.  Described pain as "achy".     History reviewed. No pertinent past medical history.  There are no problems to display for this patient.   History reviewed. No pertinent surgical history.  Prior to Admission medications   Medication Sig Start Date End Date Taking? Authorizing Provider  amoxicillin (AMOXIL) 875 MG tablet Take 1 tablet (875 mg total) by mouth 2 (two) times daily. 06/24/20   Tommi Rumps, PA-C  ibuprofen (ADVIL) 600 MG tablet Take 1 tablet (600 mg total) by mouth every 8 (eight) hours as needed. 06/24/20   Tommi Rumps, PA-C  lidocaine (XYLOCAINE) 2 % solution Use as directed 5 mLs in the mouth or throat every 6 (six) hours as needed for mouth pain. For oral swish 06/27/20   Joni Reining, PA-C  traMADol (ULTRAM) 50 MG tablet Take 1 tablet (50 mg total) by mouth every 6 (six) hours as needed. 06/27/20 06/27/21  Joni Reining, PA-C    Allergies Patient has no known allergies.  No family history on file.  Social History Social History   Tobacco Use  . Smoking status: Never Smoker  . Smokeless tobacco: Never Used  Substance Use Topics  . Alcohol use: Not Currently  . Drug use: Not on file    Review of Systems Constitutional: No fever/chills Eyes: No visual changes. ENT: No sore throat.  Dental pain. Cardiovascular: Denies chest pain. Respiratory: Denies shortness of breath. Gastrointestinal: No abdominal pain.  No nausea, no  vomiting.  No diarrhea.  No constipation. Genitourinary: Negative for dysuria. Musculoskeletal: Negative for back pain. Skin: Negative for rash. Neurological: Negative for headaches, focal weakness or numbness.   ____________________________________________   PHYSICAL EXAM:  VITAL SIGNS: ED Triage Vitals  Enc Vitals Group     BP 06/27/20 0319 131/80     Pulse Rate 06/27/20 0319 62     Resp 06/27/20 0319 18     Temp 06/27/20 0319 98.6 F (37 C)     Temp Source 06/27/20 0319 Oral     SpO2 06/27/20 0319 100 %     Weight 06/27/20 0324 253 lb 1.4 oz (114.8 kg)     Height 06/27/20 0324 5\' 8"  (1.727 m)     Head Circumference --      Peak Flow --      Pain Score 06/27/20 0324 10     Pain Loc --      Pain Edu? --      Excl. in GC? --     Constitutional: Alert and oriented.  Mild distress.   Eyes: Conjunctivae are normal. PERRL. EOMI. Head: Atraumatic. Nose: No congestion/rhinnorhea. Mouth/Throat: Mucous membranes are moist.  Oropharynx non-erythematous.  Caries left lower molars. Neck: No stridor.   Hematological/Lymphatic/Immunilogical: No cervical lymphadenopathy. Cardiovascular: Normal rate, regular rhythm. Grossly normal heart sounds.  Good peripheral circulation. Respiratory: Normal respiratory effort.  No retractions. Lungs CTAB. Skin:  Skin is warm, dry and intact. No rash noted. Psychiatric: Mood and affect  are normal. Speech and behavior are normal.  ____________________________________________   LABS (all labs ordered are listed, but only abnormal results are displayed)  Labs Reviewed - No data to display ____________________________________________  EKG   ____________________________________________  RADIOLOGY I, Joni Reining, personally viewed and evaluated these images (plain radiographs) as part of my medical decision making, as well as reviewing the written report by the radiologist.  ED MD interpretation:    Official radiology report(s): No  results found.  ____________________________________________   PROCEDURES  Procedure(s) performed (including Critical Care):  Procedures   ____________________________________________   INITIAL IMPRESSION / ASSESSMENT AND PLAN / ED COURSE  As part of my medical decision making, I reviewed the following data within the electronic MEDICAL RECORD NUMBER     Patient presented dental pain secondary to dental caries.  Patient vies continue previous medication.  Patient given prescription for viscous lidocaine and tramadol.  Patient vies follow-up scheduled dental appointment.          ____________________________________________   FINAL CLINICAL IMPRESSION(S) / ED DIAGNOSES  Final diagnoses:  Pain, dental     ED Discharge Orders         Ordered    lidocaine (XYLOCAINE) 2 % solution  Every 6 hours PRN,   Status:  Discontinued        06/27/20 0731    traMADol (ULTRAM) 50 MG tablet  Every 6 hours PRN,   Status:  Discontinued        06/27/20 0731    traMADol (ULTRAM) 50 MG tablet  Every 6 hours PRN        06/27/20 0732    lidocaine (XYLOCAINE) 2 % solution  Every 6 hours PRN        06/27/20 0732          *Please note:  Tyler Cardinal. was evaluated in Emergency Department on 06/27/2020 for the symptoms described in the history of present illness. He was evaluated in the context of the global COVID-19 pandemic, which necessitated consideration that the patient might be at risk for infection with the SARS-CoV-2 virus that causes COVID-19. Institutional protocols and algorithms that pertain to the evaluation of patients at risk for COVID-19 are in a state of rapid change based on information released by regulatory bodies including the CDC and federal and state organizations. These policies and algorithms were followed during the patient's care in the ED.  Some ED evaluations and interventions may be delayed as a result of limited staffing during and the pandemic.*   Note:   This document was prepared using Dragon voice recognition software and may include unintentional dictation errors.    Joni Reining, PA-C 06/27/20 5885    Merwyn Katos, MD 06/27/20 [phone]

## 2020-06-27 NOTE — ED Triage Notes (Addendum)
EMS brought pt in from home; reports rx amoxi 2 days ago for left sided dental pain x month; c/o persistent pain

## 2020-06-27 NOTE — Discharge Instructions (Signed)
Follow-up with scheduled dental appointment.  Continue previous medications. 

## 2022-04-14 ENCOUNTER — Ambulatory Visit: Payer: Medicaid Other

## 2023-12-08 ENCOUNTER — Ambulatory Visit (HOSPITAL_COMMUNITY)
Admission: EM | Admit: 2023-12-08 | Discharge: 2023-12-08 | Disposition: A | Discharge status: Home / Self Care | Attending: Nurse Practitioner | Admitting: Nurse Practitioner

## 2023-12-08 DIAGNOSIS — F32A Depression, unspecified: Secondary | ICD-10-CM | POA: Insufficient documentation

## 2023-12-08 DIAGNOSIS — Z79899 Other long term (current) drug therapy: Secondary | ICD-10-CM | POA: Insufficient documentation

## 2023-12-08 DIAGNOSIS — B370 Candidal stomatitis: Secondary | ICD-10-CM | POA: Insufficient documentation

## 2023-12-08 DIAGNOSIS — Z760 Encounter for issue of repeat prescription: Secondary | ICD-10-CM | POA: Insufficient documentation

## 2023-12-08 DIAGNOSIS — G4700 Insomnia, unspecified: Secondary | ICD-10-CM | POA: Insufficient documentation

## 2023-12-08 DIAGNOSIS — F17210 Nicotine dependence, cigarettes, uncomplicated: Secondary | ICD-10-CM | POA: Insufficient documentation

## 2023-12-08 DIAGNOSIS — F411 Generalized anxiety disorder: Secondary | ICD-10-CM | POA: Insufficient documentation

## 2023-12-08 MED ORDER — QUETIAPINE FUMARATE 50 MG PO TABS: 50.0000 mg | ORAL_TABLET | Freq: Every day | ORAL | 0 refills | Status: AC

## 2023-12-08 MED ORDER — QUETIAPINE FUMARATE 50 MG PO TABS: 50.0000 mg | ORAL_TABLET | Freq: Every day | ORAL | 0 refills | Status: DC

## 2023-12-08 MED ORDER — NYSTATIN 100000 UNIT/ML MT SUSP: 5.0000 mL | Freq: Four times a day (QID) | OROMUCOSAL | 0 refills | Status: AC

## 2023-12-08 MED ORDER — NYSTATIN 100000 UNIT/ML MT SUSP: 5.0000 mL | Freq: Four times a day (QID) | OROMUCOSAL | 0 refills | Status: DC

## 2023-12-08 NOTE — Progress Notes (Signed)
   12/08/23 0948  [facility] Triage Screening (Walk-ins at [facility] only)  What Is the Reason for Your Visit/Call Today? Tyler Mccarthy presents to [facility] voluntarily unaccompanied. Pt states that he was informed by TASK to come have an evaluation and medication management. Pt states that he has no other concerns other than completing his TASK requirements. Pt currently denies SI, HI, AVh AND alcohol/drug use.  How Long Has This Been Causing You Problems? 1 wk - 1 month  Have You Recently Had Any Thoughts About Hurting Yourself? No  Are You Planning to Commit Suicide/Harm Yourself At This time? No  Have you Recently Had Thoughts About Hurting Someone Karolee Ohs? No  Are You Planning To Harm Someone At This Time? No  Physical Abuse Denies  Verbal Abuse Denies  Sexual Abuse Denies  Exploitation of patient/patient's resources Denies  Self-Neglect Denies  Are you currently experiencing any auditory, visual or other hallucinations? No  Have You Used Any Alcohol or Drugs in the Past 24 Hours? No  Do you have any current medical co-morbidities that require immediate attention? No  Clinician description of patient physical appearance/behavior: groomed, calm, cooperative  What Do You Feel Would Help You the Most Today? Social Support;Medication(s)  If access to [facility] Urgent [facility] was not available, would you have sought care in the Emergency Department? No  Determination of Need Routine (7 days)  Options For Referral Medication Management;Outpatient Therapy

## 2023-12-08 NOTE — ED Provider Notes (Signed)
 Behavioral Health Urgent Care Medical Screening Exam  Patient Name: Tyler Mccarthy MRN: [id number] Date of Evaluation: 12/08/23 Chief Complaint:   Diagnosis:  Final diagnoses:  Medication management   History of Present illness: Tyler Mccarthy is a 33 y.o. male with a prior mental health history of MDD & GAD who presents today to the [facility] center with complaints of worsening insomnia & other depressive symptoms, reports that he ran out of Seroquel, and requesting medication management.   Patient [place], states that Prozac was helpful then, and is currently helpful. He shares that he is still on this med now, but does not have a Psychiatrist, reports that Seroquel was helpful, but he does not have the medication as he ran out. Pt reports trouble sleeping without the Seroquel, reports anhedonia, states that he is having trouble taking walks or working  at [employer] which is his FT job. He reports fluctuations in his appetite, states he has not been eating well due to his tongue hurting, shows writer his tongue and it has white patches on it, consistent with a fungal infection. He reports symptoms consistent with GAD; worrying excessively, feeling tense and on edge, he denies current substance use, states that he smokes cigarettes, otherwise, denies ETOH or other substance use. He denies psychosis, specifically denies AVH, denies paranoia or delusional thinking. He denies suicide thoughts, denies plans or intent to harm herself or any one else.  Patient educated on the following, verbalizes understanding: In the event that you start having suicidal ideation, homicidal ideations, having psychosis, please call [phone], 911 or come back here, and/or go to the nearest ER. Educated on the need to come back to this location on 03/28 for open access upstairs for medication management establishment of care. Verbalized understanding. Denied  safety concerns. Prescribed Nystatin solution for thrush, prescribed 7 days worth of Seroquel to help with mood and sleep.   Flowsheet Row ED from 12/08/2023 in [facility]  C-SSRS RISK CATEGORY No Risk      Psychiatric Specialty Exam  Presentation  General Appearance:Appropriate for Environment  Eye Contact:Good  Speech:Clear and Coherent  Speech Volume:Normal  Handedness:Right   Mood and Affect  Mood:Depressed; Anxious  Affect:Congruent   Thought Process  Thought Processes:Coherent  Descriptions of Associations:Intact  Orientation:Full (Time, Place and Person)  Thought Content:Logical    Hallucinations:None  Ideas of Reference:None  Suicidal Thoughts:No  Homicidal Thoughts:No   Sensorium  Memory:Immediate Good  Judgment:Good  Insight:Good   Executive Functions  Concentration:Good  Attention Span:Good  Recall:Good  Fund of Knowledge:Good  Language:Good   Psychomotor Activity  Psychomotor Activity:Normal   Assets  Assets:Resilience   Sleep  Sleep:Poor  Number of hours: No data recorded  Physical Exam: Physical Exam Vitals and nursing note reviewed.  Neurological:     General: No focal deficit present.     Mental Status: He is oriented to person, place, and time.  Psychiatric:        Behavior: Behavior normal.        Thought Content: Thought content normal.    Review of Systems  Psychiatric/Behavioral:  Positive for depression (Denies SI/HI, denies plan or intent to harm self or others). Negative for hallucinations, memory loss, substance abuse and suicidal ideas. The patient is nervous/anxious and has insomnia.   All other systems reviewed and are negative.  Blood pressure 135/79, pulse 72, temperature 98.3 F (36.8 C), temperature source Oral, resp. rate 18,  SpO2 99%. There is no height or weight on file to calculate BMI.  Musculoskeletal: Strength & Muscle Tone: within normal limits Gait &  Station: normal Patient leans: N/A   [facility] MSE Discharge Disposition for Follow up and Recommendations: Based on my evaluation the patient does not appear to have an emergency medical condition and can be discharged with resources and follow up care in outpatient services for Medication Management -7 days worth of Seroquel 50 mg script provided, educated on open access, agreeable to return 03/28 to establish care.  Starleen Blue, NP 12/08/2023, 7:43 PM

## 2023-12-08 NOTE — Discharge Instructions (Addendum)
 In the event that you start having suicidal ideation, homicidal ideations, having psychosis, please call [phone], 911 or come back here, and/or go to the nearest ER.

## 2023-12-08 NOTE — ED Notes (Signed)
 AVS taken to pt via NP [place]. Pt remains stable upon DC, no further concerns.

## 2023-12-10 ENCOUNTER — Ambulatory Visit (INDEPENDENT_AMBULATORY_CARE_PROVIDER_SITE_OTHER): Admitting: Student

## 2023-12-10 ENCOUNTER — Encounter (HOSPITAL_COMMUNITY): Payer: Self-pay | Admitting: Student

## 2023-12-10 DIAGNOSIS — F331 Major depressive disorder, recurrent, moderate: Secondary | ICD-10-CM | POA: Diagnosis not present

## 2023-12-10 DIAGNOSIS — G4733 Obstructive sleep apnea (adult) (pediatric): Secondary | ICD-10-CM

## 2023-12-10 MED ORDER — TRAZODONE HCL 50 MG PO TABS: 50.0000 mg | ORAL_TABLET | Freq: Every evening | ORAL | 1 refills | Status: AC | PRN

## 2023-12-10 MED ORDER — FLUOXETINE HCL 20 MG PO CAPS: 20.0000 mg | ORAL_CAPSULE | Freq: Every day | ORAL | 1 refills | Status: AC

## 2023-12-10 NOTE — Progress Notes (Addendum)
 Psychiatric Initial Adult Assessment  Patient Identification: Tyler Mccarthy MRN:  [id number] Date of Evaluation:  12/10/2023 Referral Source: [facility]  Assessment:  Tyler Mccarthy is a 33 y.o. male with a history of major depressive disorder who presents in person to [facility] for medication management.  Patient reports history of severe depression that has severely impacted his day-to-day living.  He does report some problems with impulse control which led to his and recent incarceration due to attempting to flee from police but this was also in context of cannabis use which he denied to me alprazolam has since stopped.  He does have history of suicidal gestures when he was a teenager resulting in psychiatric hospitalization at [facility].  He has been relatively stable with regiment of fluoxetine and had previously found quetiapine helpful for sleep.  We discussed a variety of sleeping aids but patient would ultimately benefit from sleep study due to concerns for obstructive sleep apnea (STOP-BANG 6, high risk for moderate-sever OSA).  We also discussed obtaining some labs as well as having him establish care with [facility].  Risk Assessment: A suicide and violence risk assessment was performed as part of this evaluation. There patient is deemed to be at chronic elevated risk for self-harm/suicide given the following factors: previous suicide attempt(s), impulsive tendencies, history of depression, previous acts of self harm, and chronic impulsivity. These risk factors are mitigated by the following factors: lack of active SI/HI, no known access to weapons or firearms, no history of violence, motivation for treatment, utilization of positive coping skills, supportive family, sense of responsibility to family and social supports, expresses purpose for living, current treatment compliance, effective problem solving skills, and safe housing. The  patient is deemed to be at chronic elevated risk for violence given the following factors: perceives threats in others and chronic impulsivity. These risk factors are mitigated by the following factors: no known history of violence towards others, no known history of threats of harm towards others, no active symptoms of mania, positive social orientation, and connectedness to family. There is no acute risk for suicide or violence at this time. The patient was educated about relevant modifiable risk factors including following recommendations for treatment of psychiatric illness and abstaining from substance abuse.  While future psychiatric events cannot be accurately predicted, the patient does not currently require  acute inpatient psychiatric care and does not currently meet [facility] involuntary commitment criteria.    Plan:  # Major depressive disorder, recurrent episode, moderate Past medication trials:  Status of problem: Active Interventions: -- Restart fluoxetine 20 mg -- Start trazodone 50 mg nightly as needed --Advised psychotherapy --Labs ordered: Vitamin D, TSH  # Healthcare maintenance Past medication trials:  --Advise establishing care with PCP --Labs ordered: CBC, CMP, lipid panel, A1c --ordered ECG  # Concern for obstructive sleep apnea Past medication trials:  Interventions: -- Stop-Bang score: 6, high risk for moderate to severe OSA  # History of cannabis use # History of alcohol use Interventions: -- Has not used in over 1 year -- Advised continue cessation  # Tobacco Use ~2 packs per week -- advise cessation  Return to care in 1 month  Patient was given contact information for behavioral health clinic and was instructed to call 911 for emergencies.    Patient and plan of care will be discussed with the Attending MD, Dr. Marval Regal are you covering outpatient clinic today or is a Dr. Renato Shin, who agrees  with the above statement and plan.    Subjective:  Chief Complaint: Medication Management  History of Present Illness:   Patient presents to establish care with [facility] for medication management.  He endorses recurrent history of depressed mood, anhedonia, poor sleep, increased appetite, poor energy, poor concentration.  He reports previously having suicidal ideation when he was a teenager but has not had any for several years now.  He denies SI/HI/AVH.  He reports fluoxetine has been helpful in management of his depression as well as previously Seroquel for sleep.  He reports that his sleep has been limited due to middle insomnia for which he wakes up abruptly in the middle of the night secondary to feeling short of breath or for unknown reasons.  He suspects he also has obstructive sleep apnea.  He denies history of mania or psychosis.  He does endorse history of mood swings but does not meet criteria for mania or hypomania.  He denies history of trauma nor severe/persistent anxiety.  He endorses smoking approximately 2 packs of cigarettes per week.  He reports formally smoking marijuana and alcohol but has not been over 16 months as he had been incarcerated due to resisting arrest while under the influence  Past Psychiatric History:  Diagnoses: MDD Medication trials: depakote, vyvanse Previous psychiatrist/therapist: denies Hospitalizations: several [facility], [facility] for SIB and attempted suicide age 47 Suicide attempts: 3-4, impulsive,  SIB: cut arm to release anger Hx of violence towards others: denies Current access to guns: denies Hx of trauma/abuse: denies  Substance Abuse History in the last 12 months:  No.  Past Medical History: No past medical history on file. No past surgical history on file.    Family History: No family history on file.  Social History:    Social History   Socioeconomic History   Marital status: Single    Spouse name: Not on file   Number of children: Not on file    Years of education: Not on file   Highest education level: Not on file  Occupational History   Not on file  Tobacco Use   Smoking status: Never   Smokeless tobacco: Never  Substance and Sexual Activity   Alcohol use: Yes   Drug use: Yes    Types: Methamphetamines    Comment: K2 (synthetic marijuana)   Sexual activity: Not on file  Other Topics Concern   Not on file  Social History Narrative   ** Merged History Encounter **       Social Drivers of Corporate investment banker Strain: Not on file  Food Insecurity: Not on file  Transportation Needs: Not on file  Physical Activity: Not on file  Stress: Not on file  Social Connections: Not on file    Additional Social History: updated  Allergies:  No Known Allergies  Current Medications: Current Outpatient Medications  Medication Sig Dispense Refill   ibuprofen (ADVIL) 600 MG tablet Take 1 tablet (600 mg total) by mouth every 8 (eight) hours as needed. 20 tablet 0   nystatin (MYCOSTATIN) 100000 UNIT/ML suspension Take 5 mLs (500,000 Units total) by mouth 4 (four) times daily. Switch and spit 60 mL 0   QUEtiapine (SEROQUEL) 50 MG tablet Take 1 tablet (50 mg total) by mouth at bedtime. 7 tablet 0   No current facility-administered medications for this visit.    ROS: Review of Systems   Objective:  Psychiatric Specialty Exam:  There were no vitals taken for this visit.There is  no height or weight on file to calculate BMI.  General Appearance: Casual  Eye Contact:  Good  Speech:  Clear and Coherent and Normal Rate  Volume:  Normal  Mood:  Euthymic  Affect:  Appropriate and Congruent  Thought Content: Logical   Suicidal Thoughts:  No  Homicidal Thoughts:  No  Thought Process:  Coherent, Goal Directed, and Linear  Orientation:  Full (Time, Place, and Person)  Judgment:  Intact  Insight:  Fair  Concentration:  Concentration: Fair  Fund of Knowledge: Fair  Language: Fair  Psychomotor Activity:  Normal  Akathisia:   No  AIMS (if indicated): not done  Assets:  Manufacturing systems engineer Desire for Improvement Financial Resources/Insurance Housing Leisure Time Physical Health Resilience  ADL's:  Intact  Cognition: WNL      PE: General: well-appearing; no acute distress  Pulm: no increased work of breathing on room air  Strength & Muscle Tone: wnl Neuro: no focal neurological deficits observed  Gait & Station: normal   Screenings:  Flowsheet Row ED from 12/08/2023 in [facility]  C-SSRS RISK CATEGORY No Risk        Park Pope, MD 3/28/20258:20 AM

## 2023-12-10 NOTE — Patient Instructions (Signed)
 Hi Mr. Belshe,  It was a pleasure meeting you today.  Please follow up to establish care with your primary care doctor. Please take medications as prescribed.  Take care! -Dr. Hazle Quant

## 2023-12-10 NOTE — Addendum Note (Signed)
 Addended by: Park Pope B on: 12/10/2023 12:55 PM   Modules accepted: Level of Service

## 2023-12-29 ENCOUNTER — Other Ambulatory Visit (INDEPENDENT_AMBULATORY_CARE_PROVIDER_SITE_OTHER)

## 2023-12-29 DIAGNOSIS — Z79899 Other long term (current) drug therapy: Secondary | ICD-10-CM | POA: Diagnosis not present

## 2023-12-29 DIAGNOSIS — F331 Major depressive disorder, recurrent, moderate: Secondary | ICD-10-CM

## 2023-12-29 NOTE — Progress Notes (Signed)
 Pt tolerated lab draws in right hand with no complaints,    JNL, CMA

## 2023-12-30 ENCOUNTER — Telehealth (HOSPITAL_COMMUNITY): Payer: Self-pay | Admitting: Student

## 2023-12-30 DIAGNOSIS — E559 Vitamin D deficiency, unspecified: Secondary | ICD-10-CM

## 2023-12-30 DIAGNOSIS — G4733 Obstructive sleep apnea (adult) (pediatric): Secondary | ICD-10-CM

## 2023-12-30 LAB — LIPID PANEL
Chol/HDL Ratio: 4.4 ratio (ref 0.0–5.0)
Cholesterol, Total: 157 mg/dL (ref 100–199)
HDL: 36 mg/dL — ABNORMAL LOW (ref >39)
LDL Chol Calc (NIH): 102 mg/dL — ABNORMAL HIGH (ref 0–99)
Triglycerides: 105 mg/dL (ref 0–149)
VLDL Cholesterol Cal: 19 mg/dL (ref 5–40)

## 2023-12-30 LAB — HEMOGLOBIN A1C
Est. average glucose Bld gHb Est-mCnc: 108 mg/dL
Hgb A1c MFr Bld: 5.4 % (ref 4.8–5.6)

## 2023-12-30 LAB — CBC WITH DIFFERENTIAL/PLATELET
Basophils Absolute: 0 10*3/uL (ref 0.0–0.2)
Basos: 0 %
EOS (ABSOLUTE): 0.4 10*3/uL (ref 0.0–0.4)
Eos: 4 %
Hematocrit: 44 % (ref 37.5–51.0)
Hemoglobin: 14 g/dL (ref 13.0–17.7)
Immature Grans (Abs): 0 10*3/uL (ref 0.0–0.1)
Immature Granulocytes: 0 %
Lymphocytes Absolute: 3.3 10*3/uL — ABNORMAL HIGH (ref 0.7–3.1)
Lymphs: 32 %
MCH: 26.4 pg — ABNORMAL LOW (ref 26.6–33.0)
MCHC: 31.8 g/dL (ref 31.5–35.7)
MCV: 83 fL (ref 79–97)
Monocytes Absolute: 0.7 10*3/uL (ref 0.1–0.9)
Monocytes: 7 %
Neutrophils Absolute: 5.8 10*3/uL (ref 1.4–7.0)
Neutrophils: 57 %
Platelets: 205 10*3/uL (ref 150–450)
RBC: 5.3 x10E6/uL (ref 4.14–5.80)
RDW: 13.3 % (ref 11.6–15.4)
WBC: 10.2 10*3/uL (ref 3.4–10.8)

## 2023-12-30 LAB — COMPREHENSIVE METABOLIC PANEL WITH GFR
ALT: 15 IU/L (ref 0–44)
AST: 22 IU/L (ref 0–40)
Albumin: 3.9 g/dL — ABNORMAL LOW (ref 4.1–5.1)
Alkaline Phosphatase: 57 IU/L (ref 44–121)
BUN/Creatinine Ratio: 12 (ref 9–20)
BUN: 13 mg/dL (ref 6–20)
Bilirubin Total: 0.5 mg/dL (ref 0.0–1.2)
CO2: 24 mmol/L (ref 20–29)
Calcium: 9.1 mg/dL (ref 8.7–10.2)
Chloride: 107 mmol/L — ABNORMAL HIGH (ref 96–106)
Creatinine, Ser: 1.12 mg/dL (ref 0.76–1.27)
Globulin, Total: 2.2 g/dL (ref 1.5–4.5)
Glucose: 104 mg/dL — ABNORMAL HIGH (ref 70–99)
Potassium: 4.3 mmol/L (ref 3.5–5.2)
Sodium: 145 mmol/L — ABNORMAL HIGH (ref 134–144)
Total Protein: 6.1 g/dL (ref 6.0–8.5)
eGFR: 90 mL/min/{1.73_m2} (ref >59)

## 2023-12-30 LAB — TSH+FREE T4
Free T4: 1.25 ng/dL (ref 0.82–1.77)
TSH: 3.55 u[IU]/mL (ref 0.450–4.500)

## 2023-12-30 LAB — VITAMIN D 25 HYDROXY (VIT D DEFICIENCY, FRACTURES): Vit D, 25-Hydroxy: 15 ng/mL — ABNORMAL LOW (ref 30.0–100.0)

## 2023-12-30 MED ORDER — VITAMIN D (CHOLECALCIFEROL) 25 MCG (1000 UT) PO CAPS: 1.0000 | ORAL_CAPSULE | Freq: Every day | ORAL | Status: AC

## 2023-12-30 NOTE — Telephone Encounter (Signed)
 Followed up with patient regarding labs. Advised vitamin D supplementation to address vitamin d insufficiency. Patient asked to be referred to a different sleep medicine doctor due to being told they are not taking new patients when patient called.  Plan: -vitamin D 1000 international units for 3 months then recheck vit D level -referral to sleep study for OSA  -STOPBANG 6  -snores loudly, daytime fatigue, observed to stop breathing during sleep, >35 BMI, male, >40 cm neck circumference

## 2024-01-10 ENCOUNTER — Telehealth (HOSPITAL_COMMUNITY): Payer: Self-pay

## 2024-01-10 NOTE — Telephone Encounter (Signed)
 Tyler Mccarthy with [facility] emails this therapists with ROI for update on Tyler Mccarthy. This therapist sends the following response:  He is seeing Dr. Leia Pun for medication management and has an appointment for therapy with Treasure Friendly, LCSW on 01-26-24.  Earnie Gola, MS, LMFT, LCAS

## 2024-01-13 ENCOUNTER — Encounter: Payer: Self-pay | Admitting: Neurology

## 2024-01-13 ENCOUNTER — Institutional Professional Consult (permissible substitution): Admitting: Neurology

## 2024-01-26 ENCOUNTER — Ambulatory Visit (HOSPITAL_COMMUNITY): Admitting: Licensed Clinical Social Worker

## 2024-01-28 ENCOUNTER — Encounter (HOSPITAL_COMMUNITY): Admitting: Student

## 2024-02-15 ENCOUNTER — Encounter (HOSPITAL_COMMUNITY): Admitting: Student

## 2024-03-12 ENCOUNTER — Ambulatory Visit (HOSPITAL_BASED_OUTPATIENT_CLINIC_OR_DEPARTMENT_OTHER): Attending: Psychiatry | Admitting: Internal Medicine

## 2024-03-13 ENCOUNTER — Telehealth (HOSPITAL_COMMUNITY): Payer: Self-pay | Admitting: Licensed Clinical Social Worker

## 2024-03-13 NOTE — Telephone Encounter (Signed)
 The therapist receives a ROI from [name], BA, CADC, CCJP  from [facility] and sends the following via secure email:  Mr. Tyler Mccarthy no showed for his therapy appointment on 01/26/24 and for his medication appointment on 02/15/24 and currently has no appointments scheduled with us .    Zell Maier, MA, LCSW, [facility], LCAS 03/13/2024

## 2024-04-20 ENCOUNTER — Telehealth (HOSPITAL_COMMUNITY): Payer: Self-pay

## 2024-04-20 NOTE — Telephone Encounter (Signed)
 Gave supporting Diagnosis for [employer] to bill. [employer] Rep states that this will be updated in 30-35 days.   Reference number # [id number]   JNL, CMA

## 2024-06-30 ENCOUNTER — Other Ambulatory Visit: Payer: Self-pay

## 2024-06-30 ENCOUNTER — Emergency Department (HOSPITAL_COMMUNITY)
Admission: EM | Admit: 2024-06-30 | Discharge: 2024-07-01 | Discharge status: Against Medical Advice | Payer: Self-pay | Attending: Emergency Medicine | Admitting: Emergency Medicine

## 2024-06-30 DIAGNOSIS — R109 Unspecified abdominal pain: Secondary | ICD-10-CM | POA: Insufficient documentation

## 2024-06-30 DIAGNOSIS — R42 Dizziness and giddiness: Secondary | ICD-10-CM | POA: Insufficient documentation

## 2024-06-30 DIAGNOSIS — Z5321 Procedure and treatment not carried out due to patient leaving prior to being seen by health care provider: Secondary | ICD-10-CM | POA: Insufficient documentation

## 2024-06-30 NOTE — ED Notes (Signed)
 Pt is irate with times and is cursing at staff while leaving the facility

## 2024-06-30 NOTE — ED Triage Notes (Signed)
 Pt reports lightheadedness, stomach and back problems. Dx with gastritis a few years ago, now being seen for it but it recurs. Pain has lately gotten more consistent. No otc meds tried
